# Patient Record
Sex: Male | Born: 1975 | Race: White | Hispanic: No | Smoking: Former smoker
Health system: Southern US, Community
[De-identification: ages and names within clinical notes are randomized; demographics above are authoritative.]

## PROBLEM LIST (undated history)

## (undated) DIAGNOSIS — K219 Gastro-esophageal reflux disease without esophagitis: Secondary | ICD-10-CM

## (undated) DIAGNOSIS — F988 Other specified behavioral and emotional disorders with onset usually occurring in childhood and adolescence: Secondary | ICD-10-CM

## (undated) DIAGNOSIS — F419 Anxiety disorder, unspecified: Secondary | ICD-10-CM

## (undated) DIAGNOSIS — Z973 Presence of spectacles and contact lenses: Secondary | ICD-10-CM

## (undated) DIAGNOSIS — Q309 Congenital malformation of nose, unspecified: Secondary | ICD-10-CM

## (undated) HISTORY — PX: WISDOM TOOTH EXTRACTION: SHX21

---

## 2016-12-30 ENCOUNTER — Other Ambulatory Visit: Payer: Self-pay | Admitting: Gastroenterology

## 2016-12-30 DIAGNOSIS — R10814 Left lower quadrant abdominal tenderness: Secondary | ICD-10-CM

## 2017-01-04 ENCOUNTER — Ambulatory Visit
Admission: RE | Admit: 2017-01-04 | Discharge: 2017-01-04 | Disposition: A | Discharge status: Home / Self Care | Payer: BLUE CROSS/BLUE SHIELD | Source: Ambulatory Visit | Attending: Gastroenterology | Admitting: Gastroenterology

## 2017-01-04 DIAGNOSIS — R10814 Left lower quadrant abdominal tenderness: Secondary | ICD-10-CM

## 2017-01-04 MED ORDER — IOPAMIDOL (ISOVUE-300) INJECTION 61%
125.0000 mL | Freq: Once | INTRAVENOUS | Status: AC | PRN
  Administered 2017-01-04: 125 mL via INTRAVENOUS

## 2017-01-05 IMAGING — CT CT ABD-PELV W/ CM
3 of 5 series · 13 of 36 positions shown, 19 images · IV contrast (READICAT/WATER & [ID] ISOVUE 300)
Comparison: None.

CLINICAL DATA: Left lower abdominal mass for 2 months.

EXAM:
CT ABDOMEN AND PELVIS WITH CONTRAST
TECHNIQUE: Multidetector CT imaging of the abdomen and pelvis was performed
using the standard protocol following bolus administration of
intravenous contrast.
CONTRAST:  125mL [G7] IOPAMIDOL ([G7]) INJECTION 61%

[Series 3: abd/pelvis with · axial · 0.86mm/px · z∈[-394,-40]mm · 7 of 95 slices shown, 12 images]
[im 12/95  soft-tissue]
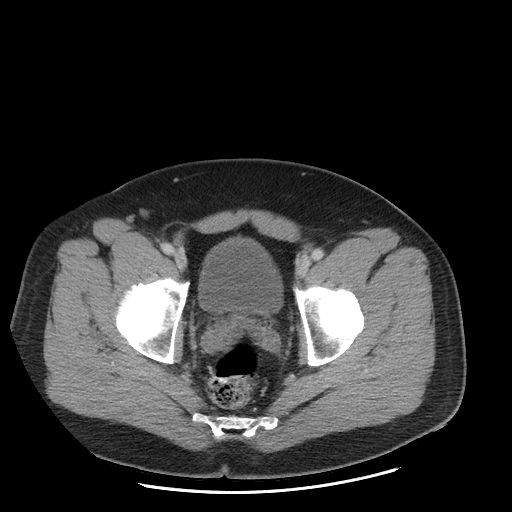
[im 12/95  bone]
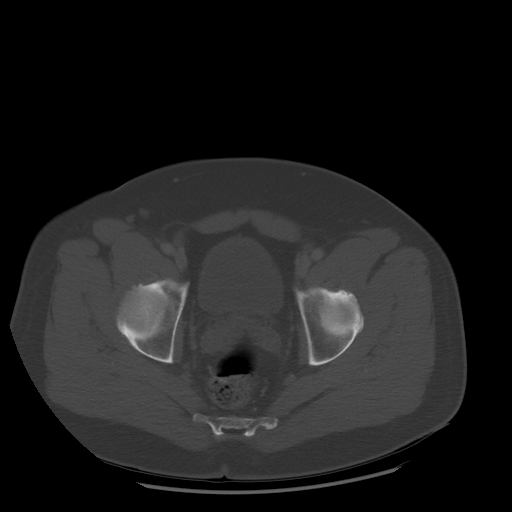
[im 24/95  soft-tissue]
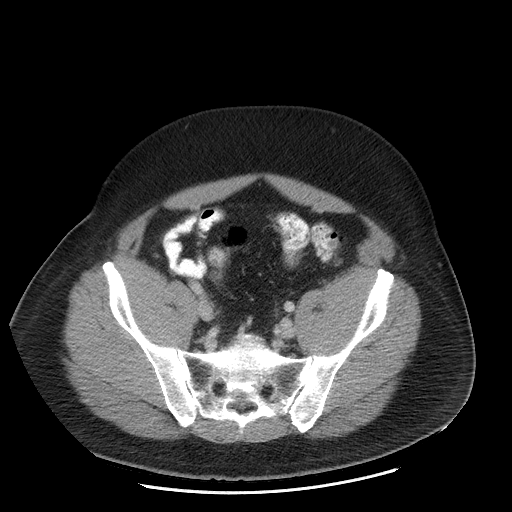
[im 36/95  soft-tissue]
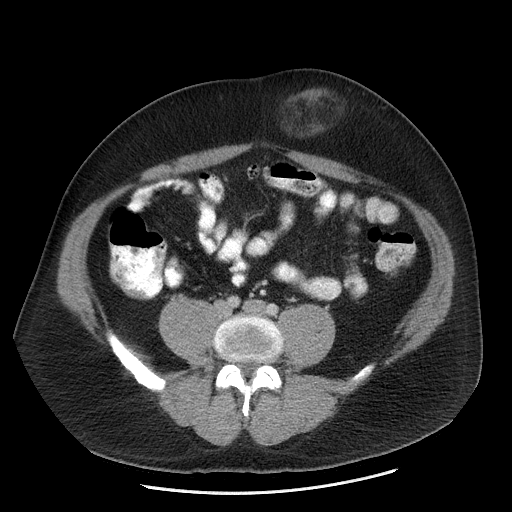
[im 48/95  soft-tissue]
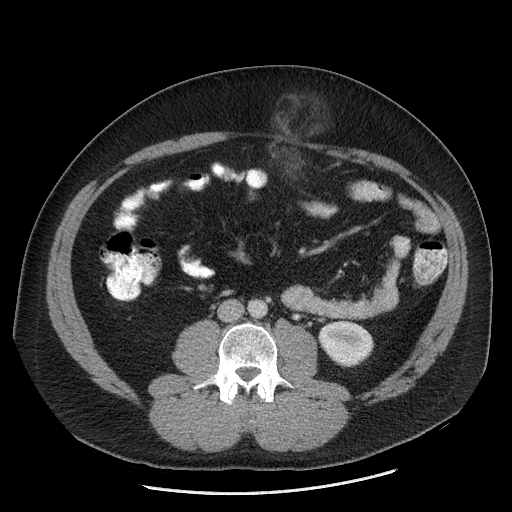
[im 48/95  lung]
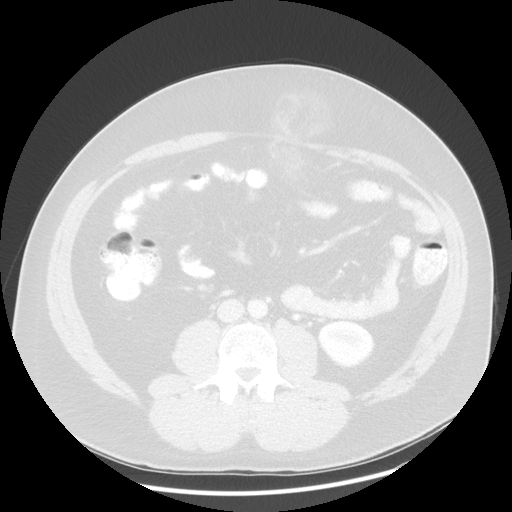
[im 59/95  soft-tissue]
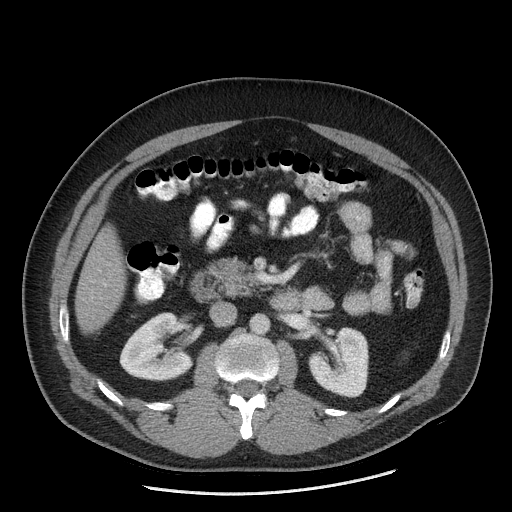
[im 59/95  lung]
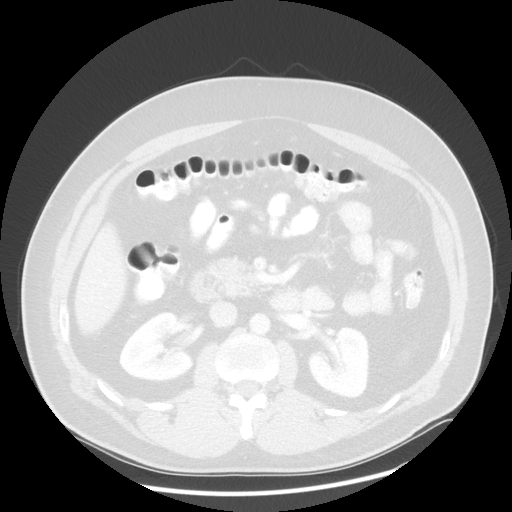
[im 71/95  soft-tissue]
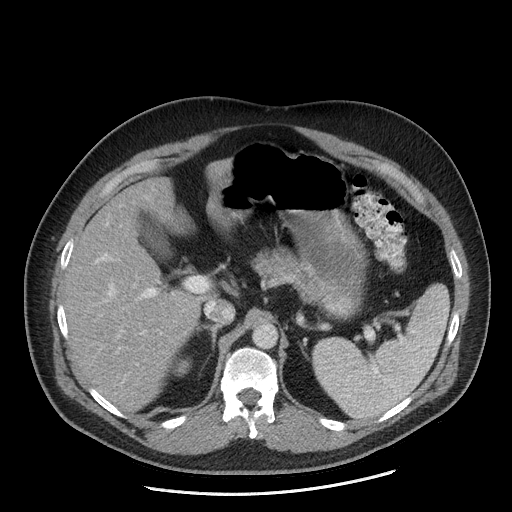
[im 71/95  lung]
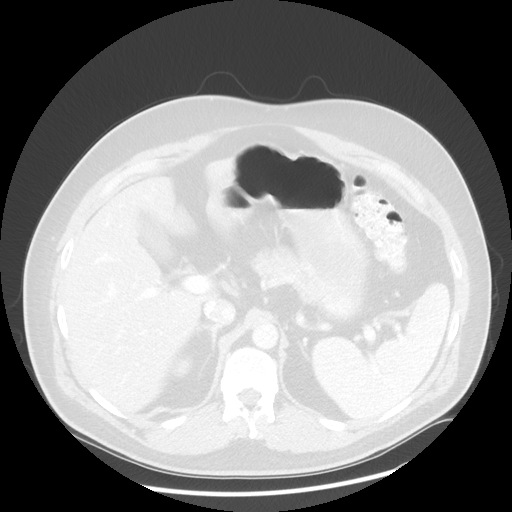
[im 83/95  soft-tissue]
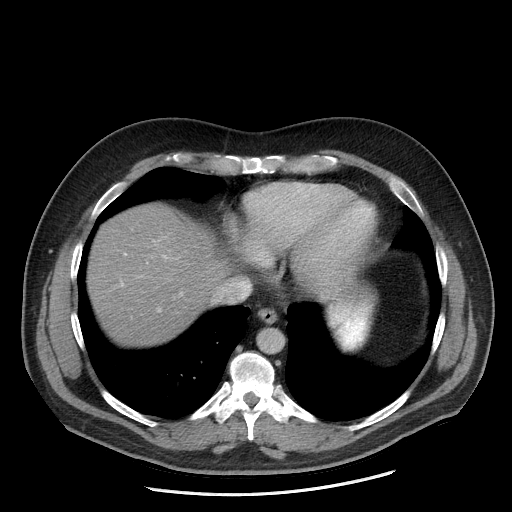
[im 83/95  lung]
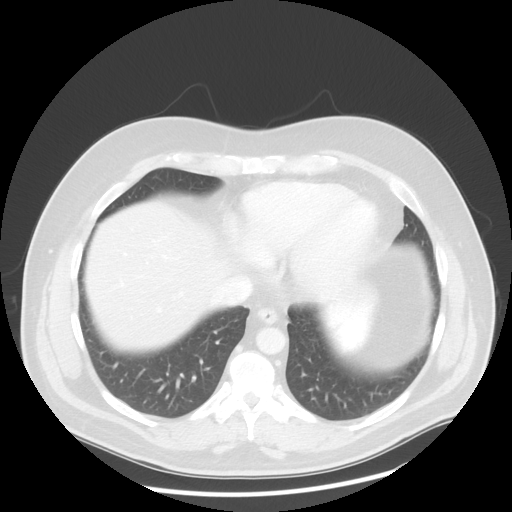

[Series 601: coronal body · coronal · 1.04mm/px · 1 of 147 slices shown, 2 images]
[im 49/147  soft-tissue]
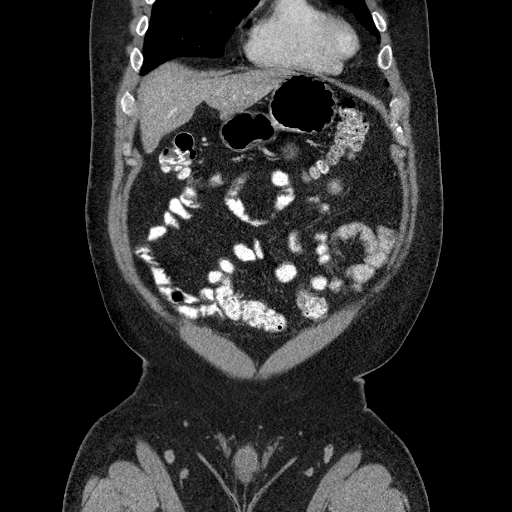
[im 49/147  bone]
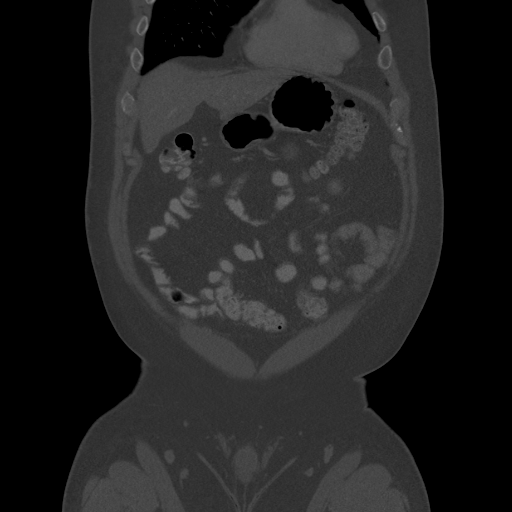

[Series 602: sagittal body · sagittal · 1.04mm/px · 5 of 173 slices shown]
[im 12/173  soft-tissue]
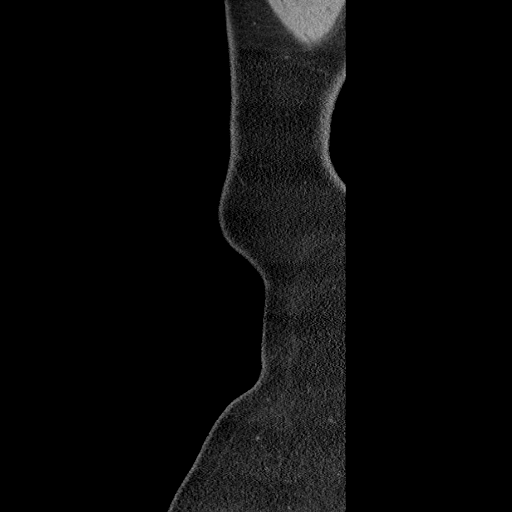
[im 35/173  soft-tissue]
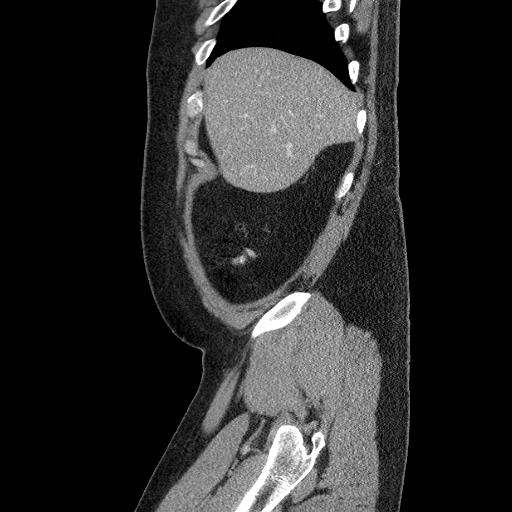
[im 58/173  soft-tissue]
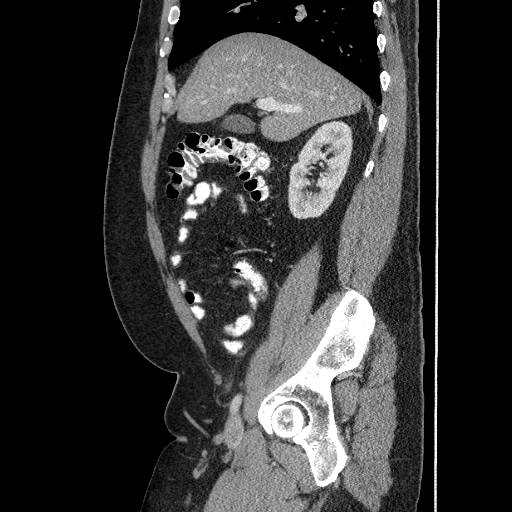
[im 81/173  soft-tissue]
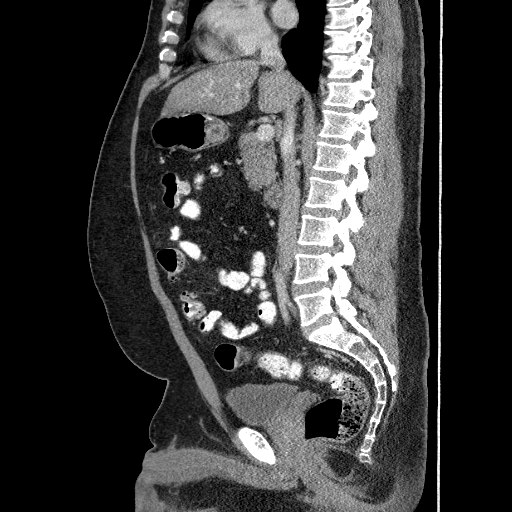
[im 92/173  soft-tissue]
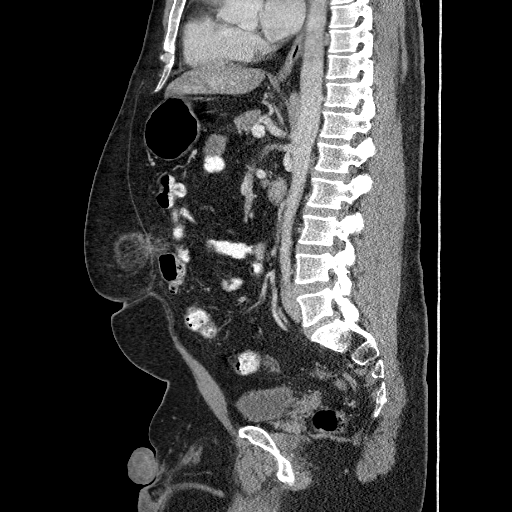

[13 of 36 positions shown; findings below may reference images not displayed]

FINDINGS: Lower chest: No acute abnormality.

Hepatobiliary: No focal liver abnormality is seen. No gallstones,
gallbladder wall thickening, or biliary dilatation.

Pancreas: Unremarkable. No pancreatic ductal dilatation or
surrounding inflammatory changes.

Spleen: Normal in size without focal abnormality.

Adrenals/Urinary Tract: Adrenal glands are unremarkable. Kidneys are
normal, without renal calculi, focal lesion, or hydronephrosis.
Bladder is unremarkable.

Stomach/Bowel: Stomach is within normal limits. Appendix appears
normal. No evidence of bowel wall thickening, distention, or
inflammatory changes.

Vascular/Lymphatic: No significant vascular findings are present. No
enlarged abdominal or pelvic lymph nodes.

Reproductive: Prostate is unremarkable.

Other: There is a supraumbilical ventral abdominal wall hernia,
containing fat only, which measures 7.5 by 6.4 by 7.4 cm (volume =
190). There is soft tissue stranding within the hernia which may be
a manifestation of incarceration.

Musculoskeletal: No acute or significant osseous findings.
IMPRESSION: 1. 190 cc Ventral abdominal wall hernia contains fat only. There is
soft tissue stranding throughout the herniated fat which may be a
manifestation of incarceration.
These results will be called to the ordering clinician or
representative by the Radiologist Assistant, and communication
documented in the PACS or zVision Dashboard.

## 2017-01-20 ENCOUNTER — Ambulatory Visit: Payer: Self-pay | Admitting: General Surgery

## 2017-01-20 NOTE — H&P (Signed)
Stephen Trujillo 01/20/2017 10:39 AM Location: [facility] Patient #: [id number] DOB: 01-16-1976 Undefined / Language: [place] / Race: White Male   History of Present Illness Stephen Trujillo(Stephen Trujillo M. Smt Lokey MD; 01/20/2017 11:13 AM) The patient is a 41 year old male who presents with an abdominal wall Trujillo. He is referred by Dr. Bosie Trujillo for evaluation of abdominal wall mass. He was found to have an incarcerated ventral Trujillo containing omentum on the CT scan of the end of August. The defect measured 5 cm wide by 4 cm vertical comes containing a large amount of omentum. He states that he has had a bulge for at least 3 months. It bothersome on a daily basis. It causes a bulging pressure and occasionally a tugging sensation. After eating he will have worsening pain in that location. He denies any nausea, vomiting, diarrhea or constipation. He does have some bloating but that is better now. He will have acid reflux issues and takes reflux medication. Occasionally he will have some bile come up at night. He did have a CT scan back in 2015 at the [facility] that showed a small fat-containing ventral Trujillo. He denies any prior abdominal surgery. He does not smoke. He is accompanied by his wife.   Problem List/Past Medical Stephen Trujillo(Stephen Trujillo M. Andrey CampanileWilson, MD; 01/20/2017 11:13 AM) Stephen Trujillo (K46.0)  OBESITY (BMI 30-39.9) (E66.9)   Past Surgical History (Stephen Trujillo, CMA; 01/20/2017 10:39 AM) Oral Surgery   Diagnostic Studies History (Stephen Trujillo, CMA; 01/20/2017 10:39 AM) Colonoscopy  never  Allergies (Stephen Trujillo, CMA; 01/20/2017 10:40 AM) Codeine Phosphate *ANALGESICS - OPIOID*  Vomiting.  Medication History (Stephen Trujillo, CMA; 01/20/2017 10:41 AM) Claritin (10MG  Tablet, Oral) Active. Tums (500MG  Tablet Chewable, Oral) Active. Zantac (150MG  Tablet, Oral) Active. Imodium A-D (2MG  Tablet, Oral) Active.  Social History (Stephen Trujillo, CMA; 01/20/2017 10:39 AM) Alcohol use   Recently quit alcohol use. Caffeine use  Coffee, Tea. Tobacco use  Former smoker.  Family History (Stephen Trujillo, CMA; 01/20/2017 10:39 AM) Alcohol Abuse  Mother. Depression  Mother. Diabetes Mellitus  Mother. Heart Disease  Mother. Hypertension  Mother. Respiratory Condition  Mother.  Other Problems Stephen Trujillo(Stephen Trujillo M. Andrey CampanileWilson, MD; 01/20/2017 11:13 AM) Gastroesophageal Reflux Disease     Review of Systems (Stephen Trujillo CMA; 01/20/2017 10:39 AM) General Not Present- Appetite Loss, Chills, Fatigue, Fever, Night Sweats, Weight Gain and Weight Loss. Skin Not Present- Change in Wart/Mole, Dryness, Hives, Jaundice, New Lesions, Non-Healing Wounds, Rash and Ulcer. HEENT Present- Seasonal Allergies, Sinus Pain and Wears glasses/contact lenses. Not Present- Earache, Hearing Loss, Hoarseness, Nose Bleed, Oral Ulcers, Ringing in the Ears, Sore Throat, Visual Disturbances and Yellow Eyes. Respiratory Present- Snoring. Not Present- Bloody sputum, Chronic Cough, Difficulty Breathing and Wheezing. Breast Not Present- Breast Mass, Breast Pain, Nipple Discharge and Skin Changes. Cardiovascular Not Present- Chest Pain, Difficulty Breathing Lying Down, Leg Cramps, Palpitations, Rapid Heart Rate, Shortness of Breath and Swelling of Extremities. Gastrointestinal Present- Abdominal Pain, Bloating, Change in Bowel Habits, Excessive gas, Nausea and Vomiting. Not Present- Bloody Stool, Chronic diarrhea, Constipation, Difficulty Swallowing, Gets full quickly at meals, Hemorrhoids, Indigestion and Rectal Pain. Male Genitourinary Not Present- Blood in Urine, Change in Urinary Stream, Frequency, Impotence, Nocturia, Painful Urination, Urgency and Urine Leakage. Musculoskeletal Not Present- Back Pain, Joint Pain, Joint Stiffness, Muscle Pain, Muscle Weakness and Swelling of Extremities. Neurological Present- Headaches. Not Present- Decreased Memory, Fainting, Numbness, Seizures, Tingling, Tremor, Trouble walking and  Weakness. Psychiatric Not Present- Anxiety, Bipolar, Change in Sleep Pattern, Depression, Fearful and Frequent crying. Endocrine  Not Present- Cold Intolerance, Excessive Hunger, Hair Changes, Heat Intolerance, Hot flashes and New Diabetes. Hematology Not Present- Blood Thinners, Easy Bruising, Excessive bleeding, Gland problems, HIV and Persistent Infections.  Vitals (Stephen Trujillo CMA; 01/20/2017 10:42 AM) 01/20/2017 10:41 AM Weight: 252.4 lb Height: 71in Body Surface Area: 2.33 m Body Mass Index: 35.2 kg/m  Pulse: 46 (Regular)  BP: 110/80 (Sitting, Left Arm, Standard)       Physical Exam (Stephen Trujillo M. Burnadette Baskett MD; 01/20/2017 11:09 AM) General Mental Status-Alert. General Appearance-Consistent with stated age. Hydration-Well hydrated. Voice-Normal. Note: obese, mostly central   Head and Neck Head-normocephalic, atraumatic with no lesions or palpable masses. Trachea-midline. Thyroid Gland Characteristics - normal size and consistency.  Eye Eyeball - Bilateral-Extraocular movements intact. Sclera/Conjunctiva - Bilateral-No scleral icterus.  ENMT Ears -Note: normal external ears.  Mouth and Throat -Note: lips.   Chest and Lung Exam Chest and lung exam reveals -quiet, even and easy respiratory effort with no use of accessory muscles and on auscultation, normal breath sounds, no adventitious sounds and normal vocal resonance. Inspection Chest Wall - Normal. Back - normal.  Breast - Did not examine.  Cardiovascular Cardiovascular examination reveals -normal heart sounds, regular rate and rhythm with no murmurs and normal pedal pulses bilaterally.  Abdomen Inspection  Inspection of the abdomen reveals: Note: palpable bulge just to left of umbilicus. mild tenderness, not reducible. no skin changes. some abd wall striae. Skin - Scar - no surgical scars. Palpation/Percussion Palpation and Percussion of the abdomen reveal - Soft, Non Tender,  No Rebound tenderness, No Rigidity (guarding) and No hepatosplenomegaly. Auscultation Auscultation of the abdomen reveals - Bowel sounds normal.  Peripheral Vascular Upper Extremity Palpation - Pulses bilaterally normal.  Neurologic Neurologic evaluation reveals -alert and oriented x 3 with no impairment of recent or remote memory. Mental Status-Normal.  Neuropsychiatric The patient's mood and affect are described as -normal. Judgment and Insight-insight is appropriate concerning matters relevant to self.  Musculoskeletal Normal Exam - Left-Upper Extremity Strength Normal and Lower Extremity Strength Normal. Normal Exam - Right-Upper Extremity Strength Normal and Lower Extremity Strength Normal.  Lymphatic Head & Neck  General Head & Neck Lymphatics: Bilateral - Description - Normal. Axillary - Did not examine. Femoral & Inguinal - Did not examine.    Assessment & Plan (Stephen Trujillo M. 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 We discussed the signs and symptoms of incarceration and strangulation. The patient was given educational material. I also drew diagrams.  We discussed nonoperative and operative management. With respect to operative management, we discussed both open repair, laparoscopic, and laparoscopic assisted repair. We discussed the pros and cons of each approach. I discussed the typical aftercare with each procedure and how each procedure differs.  The patient has elected to proceed with laparoscopic assisted repair of incarcerated ventral Trujillo iwth mesh  We discussed the risk and benefits of surgery including but not limited to bleeding, infection, injury to surrounding structures, Trujillo recurrence, mesh complications, hematoma/seroma formation, need to convert to an open procedure, blood clot formation, urinary retention, post operative ileus, general anesthesia risk,  long-term abdominal pain. We discussed that this procedure can be quite uncomfortable and difficult to recover from based on how the mesh is secured to the abdominal wall. We discussed the importance of avoiding heavy lifting and straining for a period of 6 weeks. Current Plans Pt Education - Pamphlet Given - Trujillo Surgery: discussed with patient and provided information. You are being   scheduled for surgery- Our schedulers will call you.  You should hear from our office's scheduling department within 5 working days about the location, date, and time of surgery. We try to make accommodations for patient's preferences in scheduling surgery, but sometimes the OR schedule or the surgeon's schedule prevents [place] from making those accommodations.  If you have not heard from our office [phone]) in 5 working days, call the office and ask for your surgeon's nurse.  If you have other questions about your diagnosis, plan, or surgery, call the office and ask for your surgeon's nurse.  OBESITY (BMI 30-39.9) (E66.9) Impression: We discussed the importance of working on weight loss now as well as after surgery to minimize recurrence risk. Because he is symptomatic I don't think that we can necessary delay surgery for several months  Mary Sella. Andrey Campanile, MD, FACS General, Bariatric, & Minimally Invasive Surgery [facility], [place]

## 2017-01-24 ENCOUNTER — Encounter (HOSPITAL_COMMUNITY): Payer: Self-pay | Admitting: *Deleted

## 2017-01-24 MED ORDER — DEXTROSE 5 % IV SOLN
3.0000 g | INTRAVENOUS | Status: AC
  Administered 2017-01-25: 3 g via INTRAVENOUS
  Filled 2017-01-24: qty 3

## 2017-01-24 NOTE — Progress Notes (Signed)
Called patient and spoke with his girlfriend, Stephen Trujillo. Time change for surgery 11/24/16. Patient to arrive 0900 for 1100 surgery. Clear liquids until 5 AM. No solids after midnight. [name] verbalizes understanding.

## 2017-01-25 ENCOUNTER — Encounter (HOSPITAL_COMMUNITY): Payer: Self-pay | Admitting: *Deleted

## 2017-01-25 ENCOUNTER — Ambulatory Visit (HOSPITAL_COMMUNITY): Payer: BLUE CROSS/BLUE SHIELD | Admitting: Certified Registered"

## 2017-01-25 ENCOUNTER — Observation Stay (HOSPITAL_COMMUNITY)
Admission: RE | Admit: 2017-01-25 | Discharge: 2017-01-26 | Disposition: A | Discharge status: Home / Self Care | Payer: BLUE CROSS/BLUE SHIELD | Source: Ambulatory Visit | Attending: General Surgery | Admitting: General Surgery

## 2017-01-25 ENCOUNTER — Encounter (HOSPITAL_COMMUNITY)
Admission: RE | Disposition: A | Discharge status: Home / Self Care | Payer: Self-pay | Source: Ambulatory Visit | Attending: General Surgery

## 2017-01-25 DIAGNOSIS — Z87891 Personal history of nicotine dependence: Secondary | ICD-10-CM | POA: Diagnosis not present

## 2017-01-25 DIAGNOSIS — K219 Gastro-esophageal reflux disease without esophagitis: Secondary | ICD-10-CM | POA: Insufficient documentation

## 2017-01-25 DIAGNOSIS — Z6835 Body mass index (BMI) 35.0-35.9, adult: Secondary | ICD-10-CM | POA: Insufficient documentation

## 2017-01-25 DIAGNOSIS — Z811 Family history of alcohol abuse and dependence: Secondary | ICD-10-CM | POA: Diagnosis not present

## 2017-01-25 DIAGNOSIS — Z79899 Other long term (current) drug therapy: Secondary | ICD-10-CM | POA: Insufficient documentation

## 2017-01-25 DIAGNOSIS — K4030 Unilateral inguinal hernia, with obstruction, without gangrene, not specified as recurrent: Principal | ICD-10-CM | POA: Insufficient documentation

## 2017-01-25 DIAGNOSIS — Z8249 Family history of ischemic heart disease and other diseases of the circulatory system: Secondary | ICD-10-CM | POA: Insufficient documentation

## 2017-01-25 DIAGNOSIS — Z833 Family history of diabetes mellitus: Secondary | ICD-10-CM | POA: Insufficient documentation

## 2017-01-25 DIAGNOSIS — E669 Obesity, unspecified: Secondary | ICD-10-CM | POA: Diagnosis not present

## 2017-01-25 DIAGNOSIS — K436 Other and unspecified ventral hernia with obstruction, without gangrene: Secondary | ICD-10-CM | POA: Diagnosis present

## 2017-01-25 HISTORY — PX: INSERTION OF MESH: SHX5868

## 2017-01-25 HISTORY — DX: Other specified behavioral and emotional disorders with onset usually occurring in childhood and adolescence: F98.8

## 2017-01-25 HISTORY — DX: Gastro-esophageal reflux disease without esophagitis: K21.9

## 2017-01-25 HISTORY — PX: LAPAROSCOPIC ASSISTED VENTRAL HERNIA REPAIR: SHX6312

## 2017-01-25 HISTORY — DX: Presence of spectacles and contact lenses: Z97.3

## 2017-01-25 HISTORY — DX: Anxiety disorder, unspecified: F41.9

## 2017-01-25 HISTORY — DX: Congenital malformation of nose, unspecified: Q30.9

## 2017-01-25 LAB — CBC
HCT: 43.5 % (ref 39.0–52.0)
Hemoglobin: 15.1 g/dL (ref 13.0–17.0)
MCH: 30.1 pg (ref 26.0–34.0)
MCHC: 34.7 g/dL (ref 30.0–36.0)
MCV: 86.8 fL (ref 78.0–100.0)
Platelets: 140 10*3/uL — ABNORMAL LOW (ref 150–400)
RBC: 5.01 MIL/uL (ref 4.22–5.81)
RDW: 12.6 % (ref 11.5–15.5)
WBC: 3.6 10*3/uL — AB (ref 4.0–10.5)

## 2017-01-25 LAB — CREATININE, SERUM: CREATININE: 0.93 mg/dL (ref 0.61–1.24)

## 2017-01-25 SURGERY — REPAIR, HERNIA, VENTRAL, LAPAROSCOPY-ASSISTED
Anesthesia: General

## 2017-01-25 MED ORDER — ZOLPIDEM TARTRATE 5 MG PO TABS: 5.0000 mg | ORAL_TABLET | Freq: Every evening | ORAL | Status: DC | PRN

## 2017-01-25 MED ORDER — KETAMINE HCL 10 MG/ML IJ SOLN
INTRAMUSCULAR | Status: DC | PRN
  Administered 2017-01-25 (×2): 20 mg via INTRAVENOUS

## 2017-01-25 MED ORDER — SODIUM CHLORIDE 0.9 % IV SOLN
INTRAVENOUS | Status: DC | PRN
  Administered 2017-01-25: 500 mL

## 2017-01-25 MED ORDER — RINGERS IRRIGATION IR SOLN
Status: DC | PRN
  Administered 2017-01-25: 1000 mL

## 2017-01-25 MED ORDER — SODIUM CHLORIDE 0.9 % IJ SOLN
INTRAMUSCULAR | Status: AC
  Filled 2017-01-25: qty 50

## 2017-01-25 MED ORDER — DEXAMETHASONE SODIUM PHOSPHATE 10 MG/ML IJ SOLN
INTRAMUSCULAR | Status: DC | PRN
  Administered 2017-01-25: 8 mg via INTRAVENOUS

## 2017-01-25 MED ORDER — BUPIVACAINE LIPOSOME 1.3 % IJ SUSP
20.0000 mL | Freq: Once | INTRAMUSCULAR | Status: AC
  Administered 2017-01-25: 20 mL
  Filled 2017-01-25: qty 20

## 2017-01-25 MED ORDER — ROCURONIUM BROMIDE 50 MG/5ML IV SOSY
PREFILLED_SYRINGE | INTRAVENOUS | Status: AC
  Filled 2017-01-25: qty 10

## 2017-01-25 MED ORDER — SODIUM CHLORIDE 0.9 % IV SOLN
INTRAVENOUS | Status: AC
  Filled 2017-01-25: qty 500000

## 2017-01-25 MED ORDER — ROCURONIUM BROMIDE 50 MG/5ML IV SOSY
PREFILLED_SYRINGE | INTRAVENOUS | Status: AC
  Filled 2017-01-25: qty 5

## 2017-01-25 MED ORDER — PROMETHAZINE HCL 25 MG/ML IJ SOLN: 12.5000 mg | Freq: Four times a day (QID) | INTRAMUSCULAR | Status: DC | PRN

## 2017-01-25 MED ORDER — FENTANYL CITRATE (PF) 250 MCG/5ML IJ SOLN
INTRAMUSCULAR | Status: DC | PRN
  Administered 2017-01-25 (×7): 50 ug via INTRAVENOUS

## 2017-01-25 MED ORDER — MIDAZOLAM HCL 2 MG/2ML IJ SOLN
INTRAMUSCULAR | Status: DC | PRN
  Administered 2017-01-25: 2 mg via INTRAVENOUS

## 2017-01-25 MED ORDER — LIDOCAINE 2% (20 MG/ML) 5 ML SYRINGE
INTRAMUSCULAR | Status: DC | PRN
  Administered 2017-01-25: 1.5 mg/kg/h via INTRAVENOUS

## 2017-01-25 MED ORDER — ONDANSETRON HCL 4 MG/2ML IJ SOLN
INTRAMUSCULAR | Status: AC
  Filled 2017-01-25: qty 2

## 2017-01-25 MED ORDER — ONDANSETRON HCL 4 MG/2ML IJ SOLN: 4.0000 mg | Freq: Four times a day (QID) | INTRAMUSCULAR | Status: DC | PRN

## 2017-01-25 MED ORDER — EPHEDRINE SULFATE-NACL 50-0.9 MG/10ML-% IV SOSY
PREFILLED_SYRINGE | INTRAVENOUS | Status: DC | PRN
  Administered 2017-01-25 (×2): 5 mg via INTRAVENOUS

## 2017-01-25 MED ORDER — MORPHINE SULFATE (PF) 4 MG/ML IV SOLN: 1.0000 mg | INTRAVENOUS | Status: DC | PRN

## 2017-01-25 MED ORDER — FENTANYL CITRATE (PF) 250 MCG/5ML IJ SOLN
INTRAMUSCULAR | Status: AC
  Filled 2017-01-25: qty 5

## 2017-01-25 MED ORDER — PROPOFOL 10 MG/ML IV BOLUS
INTRAVENOUS | Status: DC | PRN
  Administered 2017-01-25: 200 mg via INTRAVENOUS

## 2017-01-25 MED ORDER — CHLORHEXIDINE GLUCONATE CLOTH 2 % EX PADS: 6.0000 | MEDICATED_PAD | Freq: Once | CUTANEOUS | Status: DC

## 2017-01-25 MED ORDER — DEXAMETHASONE SODIUM PHOSPHATE 10 MG/ML IJ SOLN
INTRAMUSCULAR | Status: AC
  Filled 2017-01-25: qty 1

## 2017-01-25 MED ORDER — PROPOFOL 10 MG/ML IV BOLUS
INTRAVENOUS | Status: AC
  Filled 2017-01-25: qty 20

## 2017-01-25 MED ORDER — LIDOCAINE 2% (20 MG/ML) 5 ML SYRINGE
INTRAMUSCULAR | Status: AC
  Filled 2017-01-25: qty 5

## 2017-01-25 MED ORDER — LACTATED RINGERS IV SOLN
INTRAVENOUS | Status: DC
  Administered 2017-01-25 – 2017-01-26 (×5): via INTRAVENOUS

## 2017-01-25 MED ORDER — SUGAMMADEX SODIUM 200 MG/2ML IV SOLN
INTRAVENOUS | Status: DC | PRN
  Administered 2017-01-25: 200 mg via INTRAVENOUS

## 2017-01-25 MED ORDER — PHENYLEPHRINE 40 MCG/ML (10ML) SYRINGE FOR IV PUSH (FOR BLOOD PRESSURE SUPPORT)
PREFILLED_SYRINGE | INTRAVENOUS | Status: AC
  Filled 2017-01-25: qty 10

## 2017-01-25 MED ORDER — DOCUSATE SODIUM 100 MG PO CAPS
100.0000 mg | ORAL_CAPSULE | Freq: Two times a day (BID) | ORAL | Status: DC
  Administered 2017-01-25 – 2017-01-26 (×2): 100 mg via ORAL
  Filled 2017-01-25 (×2): qty 1

## 2017-01-25 MED ORDER — SUGAMMADEX SODIUM 200 MG/2ML IV SOLN
INTRAVENOUS | Status: AC
  Filled 2017-01-25: qty 2

## 2017-01-25 MED ORDER — PANTOPRAZOLE SODIUM 40 MG IV SOLR
40.0000 mg | Freq: Every day | INTRAVENOUS | Status: DC
  Administered 2017-01-25: 23:00:00 40 mg via INTRAVENOUS
  Filled 2017-01-25: qty 40

## 2017-01-25 MED ORDER — MEPERIDINE HCL 50 MG/ML IJ SOLN: 6.2500 mg | INTRAMUSCULAR | Status: DC | PRN

## 2017-01-25 MED ORDER — ONDANSETRON 4 MG PO TBDP
4.0000 mg | ORAL_TABLET | Freq: Four times a day (QID) | ORAL | Status: DC | PRN
  Administered 2017-01-25: 4 mg via ORAL
  Filled 2017-01-25: qty 1

## 2017-01-25 MED ORDER — LACTATED RINGERS IV SOLN: INTRAVENOUS | Status: DC

## 2017-01-25 MED ORDER — LIDOCAINE 2% (20 MG/ML) 5 ML SYRINGE
INTRAMUSCULAR | Status: DC | PRN
  Administered 2017-01-25: 60 mg via INTRAVENOUS

## 2017-01-25 MED ORDER — GABAPENTIN 300 MG PO CAPS
300.0000 mg | ORAL_CAPSULE | ORAL | Status: AC
  Administered 2017-01-25: 300 mg via ORAL
  Filled 2017-01-25: qty 1

## 2017-01-25 MED ORDER — SODIUM CHLORIDE 0.9 % IJ SOLN
INTRAMUSCULAR | Status: DC | PRN
  Administered 2017-01-25: 50 mL

## 2017-01-25 MED ORDER — KETAMINE HCL 10 MG/ML IJ SOLN
INTRAMUSCULAR | Status: AC
  Filled 2017-01-25: qty 1

## 2017-01-25 MED ORDER — GABAPENTIN 300 MG PO CAPS
300.0000 mg | ORAL_CAPSULE | Freq: Two times a day (BID) | ORAL | Status: DC
  Administered 2017-01-25 – 2017-01-26 (×2): 300 mg via ORAL
  Filled 2017-01-25 (×2): qty 1

## 2017-01-25 MED ORDER — ONDANSETRON HCL 4 MG/2ML IJ SOLN
INTRAMUSCULAR | Status: DC | PRN
  Administered 2017-01-25: 4 mg via INTRAVENOUS

## 2017-01-25 MED ORDER — KETOROLAC TROMETHAMINE 30 MG/ML IJ SOLN
30.0000 mg | Freq: Three times a day (TID) | INTRAMUSCULAR | Status: DC
  Administered 2017-01-25 – 2017-01-26 (×2): 30 mg via INTRAVENOUS
  Filled 2017-01-25 (×2): qty 1

## 2017-01-25 MED ORDER — SUCCINYLCHOLINE CHLORIDE 200 MG/10ML IV SOSY
PREFILLED_SYRINGE | INTRAVENOUS | Status: DC | PRN
  Administered 2017-01-25: 120 mg via INTRAVENOUS

## 2017-01-25 MED ORDER — METOCLOPRAMIDE HCL 5 MG/ML IJ SOLN: 10.0000 mg | Freq: Once | INTRAMUSCULAR | Status: DC | PRN

## 2017-01-25 MED ORDER — ACETAMINOPHEN 500 MG PO TABS
1000.0000 mg | ORAL_TABLET | ORAL | Status: AC
  Administered 2017-01-25: 1000 mg via ORAL
  Filled 2017-01-25: qty 2

## 2017-01-25 MED ORDER — MIDAZOLAM HCL 2 MG/2ML IJ SOLN
INTRAMUSCULAR | Status: AC
  Filled 2017-01-25: qty 2

## 2017-01-25 MED ORDER — SIMETHICONE 80 MG PO CHEW: 40.0000 mg | CHEWABLE_TABLET | Freq: Four times a day (QID) | ORAL | Status: DC | PRN

## 2017-01-25 MED ORDER — ACETAMINOPHEN 500 MG PO TABS
1000.0000 mg | ORAL_TABLET | Freq: Four times a day (QID) | ORAL | Status: DC
  Administered 2017-01-25 – 2017-01-26 (×4): 1000 mg via ORAL
  Filled 2017-01-25 (×4): qty 2

## 2017-01-25 MED ORDER — FENTANYL CITRATE (PF) 100 MCG/2ML IJ SOLN: 25.0000 ug | INTRAMUSCULAR | Status: DC | PRN

## 2017-01-25 MED ORDER — EPHEDRINE 5 MG/ML INJ
INTRAVENOUS | Status: AC
  Filled 2017-01-25: qty 10

## 2017-01-25 MED ORDER — GLYCOPYRROLATE 0.2 MG/ML IV SOSY
PREFILLED_SYRINGE | INTRAVENOUS | Status: AC
  Filled 2017-01-25: qty 5

## 2017-01-25 MED ORDER — DIPHENHYDRAMINE HCL 50 MG/ML IJ SOLN: 12.5000 mg | Freq: Four times a day (QID) | INTRAMUSCULAR | Status: DC | PRN

## 2017-01-25 MED ORDER — ROCURONIUM BROMIDE 10 MG/ML (PF) SYRINGE
PREFILLED_SYRINGE | INTRAVENOUS | Status: DC | PRN
  Administered 2017-01-25: 5 mg via INTRAVENOUS
  Administered 2017-01-25: 30 mg via INTRAVENOUS
  Administered 2017-01-25 (×2): 10 mg via INTRAVENOUS

## 2017-01-25 MED ORDER — DIPHENHYDRAMINE HCL 12.5 MG/5ML PO ELIX: 12.5000 mg | ORAL_SOLUTION | Freq: Four times a day (QID) | ORAL | Status: DC | PRN

## 2017-01-25 MED ORDER — ENOXAPARIN SODIUM 40 MG/0.4ML ~~LOC~~ SOLN: 40.0000 mg | SUBCUTANEOUS | Status: DC

## 2017-01-25 MED ORDER — OXYCODONE HCL 5 MG PO TABS: 5.0000 mg | ORAL_TABLET | ORAL | Status: DC | PRN

## 2017-01-25 MED ORDER — GLYCOPYRROLATE 0.2 MG/ML IV SOSY
PREFILLED_SYRINGE | INTRAVENOUS | Status: DC | PRN
  Administered 2017-01-25: .2 mg via INTRAVENOUS

## 2017-01-25 SURGICAL SUPPLY — 47 items
APPLIER CLIP LOGIC TI 5 (MISCELLANEOUS) IMPLANT
BENZOIN TINCTURE PRP APPL 2/3 (GAUZE/BANDAGES/DRESSINGS) ×3 IMPLANT
BINDER ABDOMINAL 12 ML 46-62 (SOFTGOODS) IMPLANT
CABLE HIGH FREQUENCY MONO STRZ (ELECTRODE) ×3 IMPLANT
CHLORAPREP W/TINT 26ML (MISCELLANEOUS) ×3 IMPLANT
CLOSURE WOUND 1/2 X4 (GAUZE/BANDAGES/DRESSINGS) ×1
COVER SURGICAL LIGHT HANDLE (MISCELLANEOUS) ×3 IMPLANT
DECANTER SPIKE VIAL GLASS SM (MISCELLANEOUS) ×3 IMPLANT
DERMABOND ADVANCED (GAUZE/BANDAGES/DRESSINGS)
DERMABOND ADVANCED .7 DNX12 (GAUZE/BANDAGES/DRESSINGS) IMPLANT
DEVICE SECURE STRAP 25 ABSORB (INSTRUMENTS) IMPLANT
DEVICE TROCAR PUNCTURE CLOSURE (ENDOMECHANICALS) ×3 IMPLANT
DRAIN CHANNEL 19F RND (DRAIN) IMPLANT
DRAPE INCISE IOBAN 66X45 STRL (DRAPES) IMPLANT
DRAPE UTILITY XL STRL (DRAPES) IMPLANT
DRSG OPSITE POSTOP 4X6 (GAUZE/BANDAGES/DRESSINGS) ×3 IMPLANT
ELECT PENCIL ROCKER SW 15FT (MISCELLANEOUS) IMPLANT
ELECT REM PT RETURN 15FT ADLT (MISCELLANEOUS) ×3 IMPLANT
EVACUATOR SILICONE 100CC (DRAIN) IMPLANT
GOWN STRL REUS W/TWL XL LVL3 (GOWN DISPOSABLE) ×9 IMPLANT
KIT BASIN OR (CUSTOM PROCEDURE TRAY) ×3 IMPLANT
L-HOOK LAP DISP 36CM (ELECTROSURGICAL)
LHOOK LAP DISP 36CM (ELECTROSURGICAL) IMPLANT
MARKER SKIN DUAL TIP RULER LAB (MISCELLANEOUS) ×3 IMPLANT
MESH VENTRALIGHT ST 6IN CRC (Mesh General) ×3 IMPLANT
NEEDLE SPNL 22GX3.5 QUINCKE BK (NEEDLE) ×6 IMPLANT
PAD POSITIONING PINK XL (MISCELLANEOUS) IMPLANT
SCISSORS LAP 5X35 DISP (ENDOMECHANICALS) ×3 IMPLANT
SET IRRIG TUBING LAPAROSCOPIC (IRRIGATION / IRRIGATOR) ×3 IMPLANT
SHEARS HARMONIC ACE PLUS 36CM (ENDOMECHANICALS) IMPLANT
SLEEVE XCEL OPT CAN 5 100 (ENDOMECHANICALS) ×3 IMPLANT
STRIP CLOSURE SKIN 1/2X4 (GAUZE/BANDAGES/DRESSINGS) ×2 IMPLANT
SUT ETHILON 2 0 PS N (SUTURE) IMPLANT
SUT MNCRL AB 4-0 PS2 18 (SUTURE) ×3 IMPLANT
SUT NOVA 1 T20/GS 25DT (SUTURE) ×3 IMPLANT
SUT NOVA NAB DX-16 0-1 5-0 T12 (SUTURE) ×6 IMPLANT
SUT NOVA NAB GS-21 0 18 T12 DT (SUTURE) ×3 IMPLANT
SUT VIC AB 3-0 SH 27 (SUTURE) ×2
SUT VIC AB 3-0 SH 27XBRD (SUTURE) ×1 IMPLANT
TOWEL OR 17X26 10 PK STRL BLUE (TOWEL DISPOSABLE) ×3 IMPLANT
TOWEL OR NON WOVEN STRL DISP B (DISPOSABLE) ×3 IMPLANT
TRAY FOLEY W/METER SILVER 14FR (SET/KITS/TRAYS/PACK) IMPLANT
TRAY FOLEY W/METER SILVER 16FR (SET/KITS/TRAYS/PACK) ×3 IMPLANT
TRAY LAPAROSCOPIC (CUSTOM PROCEDURE TRAY) ×3 IMPLANT
TROCAR BLADELESS OPT 5 100 (ENDOMECHANICALS) ×3 IMPLANT
TROCAR XCEL NON-BLD 11X100MML (ENDOMECHANICALS) IMPLANT
TUBING INSUF HEATED (TUBING) ×3 IMPLANT

## 2017-01-25 NOTE — Op Note (Signed)
Stephen Trujillo [id number] June 13, 1975 01/25/2017  Laparoscopic Assisted Repair of Incarcerated Ventral Hernia with Mesh Procedure Note  Indications: Symptomatic incarcerated ventral hernia  Pre-operative Diagnosis: ventral incarcerated hernia  Post-operative Diagnosis: same  Surgeon: Atilano Ina   Assistants: none  Anesthesia: General endotracheal anesthesia  Procedure Details  The patient was seen in the Holding Room. The risks, benefits, complications, treatment options, and expected outcomes were discussed with the patient. The possibilities of reaction to medication, pulmonary aspiration, perforation of viscus, bleeding, recurrent infection, the need for additional procedures, failure to diagnose a condition, and creating a complication requiring transfusion or operation were discussed with the patient. The patient concurred with the proposed plan, giving informed consent.  The site of surgery properly noted/marked. The patient was taken to the operating room, identified as Stephen Trujillo and the procedure verified as laparoscopic assisted ventral hernia repair with mesh. A Time Out was held and the above information confirmed.  He received preop ERAS medications.   The patient was placed supine.  After establishing general anesthesia, a Foley catheter was placed.  The abdomen was prepped with Chloraprep and draped in standard fashion.  A 5 mm Optiview was used the cannulate the peritoneal cavity in the right upper quadrant below the costal margin since the hernia was to the left of the upper midline.  Pneumoperitoneum was obtained by insufflating CO2, maintaining a maximum pressure of 15 mmHg.  The 5 mm 30-degree laparoscopic was inserted.  There were significant omental stuck in and around the hernia defect.  An 5-mm port was placed in the right anterior axillary line at the level of the umbilicus.   Harmonic scalpel and gentle traction were used to dissect the omental adhesions away  from the anterior abdominal wall and traction to reduce the herniated omentum.  We cleared the entire abdominal wall and were able to visualize 1 fascial defects in the upper midline about 2 in above the umbilicus which is wider than taller that extended to the left. We used a spinal needle to identify the extent of the hernia defects.  This covered an area of about 6 x 3.5 cms.  We selected a round 6 inch piece of Bard VentralightSt mesh.  We placed 8 stay sutures of 0 Novofil around the edges of the mesh.     A transverse supraumbilical incision was created directly over the hernia.. Dissection was carried down to the hernia sac located above the fascia and was mobilized from surrounding structures. The hernia sac was quite thick and couldn't really invert back into the abdomen so it was excised with cautery.  Intact fascia was identified circumferentially around the defect. A small amount of soft tissue was mobilized from the surface of the fascia in a circumferential manner. The mesh was then placed thru the defect. The fascia was then closed transversely and primarily over the mesh with 7 interrupted 0-novafil sutures.  Pneumoperitoneum was re-established. I placed exparel in a regional manner around the fascial closure.  The mesh was then unrolled.  The stay sutures were then pulled up through small stab incisions using the Endo-close device.  This deployed the mesh widely over the closed fascial defect.  Exparel was then infiltrated around the sites of the stay sutures. The stay sutures were then tied down.  The Secure Strap device was then used to tack down the edges of the mesh at 1 cm intervals circumferentially. We placed a few tacks inside the outer ring of tacks.  We inspected  for hemostasis.    Pneumoperitoneum was then released as we removed the remainder of the trocars.   The small transverse cavity was irrigated with antibiotic irrigation. The soft tissue was closed with running 2-0 vicryl  suture.  The port sites were closed with 4-0 Monocryl.  All of the incisions and stay suture sites were then sealed with benzoin, steri-strips and bandages.  An abdominal binder was placed around the patient's abdomen.  The patient was extubated and brought to the recovery room in stable condition.  All sponge, instrument, and needle counts were correct prior to closure and at the conclusion of the case.   Findings: Type of repair - primary suture with mesh underlay  (choices - primary suture, mesh, or component)  Name of mesh - Bard VentralightST  Size of mesh - Round 6 inch  Mesh overlap - >5 cm  Placement of mesh -  beneath fascia and into peritoneal cavity,  (choices - beneath fascia and into peritoneal cavity, beneath fascia but external to peritoneal cavity, between the muscle and fascia, above or external to fascia)  Estimated Blood Loss:  less than 50 mL         Complications:  None; patient tolerated the procedure well.         Disposition: PACU - hemodynamically stable.         Condition: stable

## 2017-01-25 NOTE — Anesthesia Preprocedure Evaluation (Addendum)
Anesthesia Evaluation  Patient identified by MRN, date of birth, ID band Patient awake    Reviewed: Allergy & Precautions, NPO status , Patient's Chart, lab work & pertinent test results  Airway Mallampati: II  TM Distance: >3 FB Neck ROM: Full    Dental no notable dental hx. (+) Dental Advisory Given, Chipped   Pulmonary former smoker,    Pulmonary exam normal breath sounds clear to auscultation       Cardiovascular negative cardio ROS Normal cardiovascular exam Rhythm:Regular Rate:Normal     Neuro/Psych negative neurological ROS  negative psych ROS   GI/Hepatic negative GI ROS, Neg liver ROS,   Endo/Other  negative endocrine ROS  Renal/GU negative Renal ROS  negative genitourinary   Musculoskeletal negative musculoskeletal ROS (+)   Abdominal   Peds negative pediatric ROS (+)  Hematology negative hematology ROS (+)   Anesthesia Other Findings   Reproductive/Obstetrics negative OB ROS                            Anesthesia Physical Anesthesia Plan  ASA: II  Anesthesia Plan: General   Post-op Pain Management:    Induction: Intravenous  PONV Risk Score and Plan: 3 and Ondansetron, Dexamethasone, Midazolam and Treatment may vary due to age or medical condition  Airway Management Planned: Oral ETT  Additional Equipment:   Intra-op Plan:   Post-operative Plan: Extubation in OR  Informed Consent: I have reviewed the patients History and Physical, chart, labs and discussed the procedure including the risks, benefits and alternatives for the proposed anesthesia with the patient or authorized representative who has indicated his/her understanding and acceptance.   Dental advisory given  Plan Discussed with: CRNA  Anesthesia Plan Comments:         Anesthesia Quick Evaluation

## 2017-01-25 NOTE — Anesthesia Procedure Notes (Signed)
Date/Time: 01/25/2017 2:10 PM Performed by: Minerva Ends Oxygen Delivery Method: Simple face mask Placement Confirmation: positive ETCO2 and breath sounds checked- equal and bilateral Dental Injury: Teeth and Oropharynx as per pre-operative assessment  Comments: EXTUBATED--- FACE MASK--- GOOD aw-- O2 intact to PACU

## 2017-01-25 NOTE — Anesthesia Procedure Notes (Signed)
Procedure Name: Intubation Date/Time: 01/25/2017 11:47 AM Performed by: Minerva Ends Pre-anesthesia Checklist: Patient identified, Emergency Drugs available, Suction available and Patient being monitored Patient Re-evaluated:Patient Re-evaluated prior to induction Oxygen Delivery Method: Circle System Utilized Preoxygenation: Pre-oxygenation with 100% oxygen Induction Type: IV induction Ventilation: Mask ventilation without difficulty Laryngoscope Size: Miller and 2 Grade View: Grade I Tube type: Oral Number of attempts: 1 Airway Equipment and Method: Stylet Placement Confirmation: ETT inserted through vocal cords under direct vision,  positive ETCO2 and breath sounds checked- equal and bilateral Secured at: 21 cm Tube secured with: Tape Dental Injury: Teeth and Oropharynx as per pre-operative assessment  Comments: Smooth IV induction  [place]-- intubation AM CRNA--- front teeth with chipped -- irregular surfaces prior to intubation--- intubation atraumatic-- teeth unchanged--- bilat BS Car[place]an

## 2017-01-25 NOTE — Anesthesia Postprocedure Evaluation (Signed)
Anesthesia Post Note  Patient: Stephen Trujillo  Procedure(s) Performed: Procedure(s) (LRB): LAPAROSCOPIC ASSISTED REPAIR OF INCARCERATED VENTRAL HERNIA REPAIR (N/A) INSERTION OF MESH (N/A)     Patient location during evaluation: PACU Anesthesia Type: General Level of consciousness: awake and alert Pain management: pain level controlled Vital Signs Assessment: post-procedure vital signs reviewed and stable Respiratory status: spontaneous breathing, nonlabored ventilation, respiratory function stable and patient connected to nasal cannula oxygen Cardiovascular status: blood pressure returned to baseline and stable Postop Assessment: no apparent nausea or vomiting Anesthetic complications: no    Last Vitals:  Vitals:   01/25/17 1500 01/25/17 1515  BP: 120/81 120/68  Pulse: 88 68  Resp: 15 16  Temp: 36.4 C 36.6 C  SpO2: 95% 98%    Last Pain:  Vitals:   01/25/17 1430  TempSrc:   PainSc: Asleep                 Phillips Grout

## 2017-01-25 NOTE — Interval H&P Note (Signed)
History and Physical Interval Note:  01/25/2017 11:05 AM  Cleon Gustin  has presented today for surgery, with the diagnosis of incarcerated venteral hernia  The various methods of treatment have been discussed with the patient and family. After consideration of risks, benefits and other options for treatment, the patient has consented to  Procedure(s): LAPAROSCOPIC ASSISTED REPAIR OF INCARCERATED VENTRAL HERNIA REPAIR (N/A) INSERTION OF MESH (N/A) as a surgical intervention .  The patient's history has been reviewed, patient examined, no change in status, stable for surgery.  I have reviewed the patient's chart and labs.  Questions were answered to the patient's satisfaction.    Mary Sella. Andrey Campanile, MD, FACS General, Bariatric, & Minimally Invasive Surgery [facility], [place]  [facility]

## 2017-01-25 NOTE — Transfer of Care (Signed)
Immediate Anesthesia Transfer of Care Note  Patient: Stephen Trujillo  Procedure(s) Performed: Procedure(s): LAPAROSCOPIC ASSISTED REPAIR OF INCARCERATED VENTRAL HERNIA REPAIR (N/A) INSERTION OF MESH (N/A)  Patient Location: PACU  Anesthesia Type:General  Level of Consciousness: sedated  Airway & Oxygen Therapy: Patient Spontanous Breathing and Patient connected to face mask oxygen  Post-op Assessment: Report given to RN and Post -op Vital signs reviewed and stable  Post vital signs: Reviewed and stable  Last Vitals:  Vitals:   01/25/17 0900  BP: 127/72  Pulse: 61  Resp: 18  Temp: 36.5 C  SpO2: 97%    Last Pain:  Vitals:   01/25/17 0900  TempSrc: Oral      Patients Stated Pain Goal: 2 (01/25/17 0947)  Complications: No apparent anesthesia complications

## 2017-01-25 NOTE — H&P (View-Only) (Signed)
Stephen FurlongJason Trujillo 01/20/2017 10:39 AM Location: [facility] Patient #: [id number] DOB: 01-16-1976 Undefined / Language: [place] / Race: White Male   History of Present Illness Stephen Trujillo(Stephen Ruybal M. Heidy Mccubbin MD; 01/20/2017 11:13 AM) The patient is a 41 year old male who presents with an abdominal wall hernia. He is referred by Dr. Bosie ClosSchooler for evaluation of abdominal wall mass. He was found to have an incarcerated ventral hernia containing omentum on the CT scan of the end of August. The defect measured 5 cm wide by 4 cm vertical comes containing a large amount of omentum. He states that he has had a bulge for at least 3 months. It bothersome on a daily basis. It causes a bulging pressure and occasionally a tugging sensation. After eating he will have worsening pain in that location. He denies any nausea, vomiting, diarrhea or constipation. He does have some bloating but that is better now. He will have acid reflux issues and takes reflux medication. Occasionally he will have some bile come up at night. He did have a CT scan back in 2015 at the [facility] that showed a small fat-containing ventral hernia. He denies any prior abdominal surgery. He does not smoke. He is accompanied by his wife.   Problem List/Past Medical Stephen Trujillo(Stephen Rahming M. 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  Medication History (Stephen Trujillo, CMA; 01/20/2017 10:41 AM) Claritin (10MG  Tablet, Oral) Active. Tums (500MG  Tablet Chewable, Oral) Active. Zantac (150MG  Tablet, Oral) Active. Imodium A-D (2MG  Tablet, Oral) Active.  Social History (Stephen Trujillo, CMA; 01/20/2017 10:39 AM) Alcohol use   Recently quit alcohol use. Caffeine use  Coffee, Tea. Tobacco use  Former smoker.  Family History (Stephen Trujillo, CMA; 01/20/2017 10:39 AM) Alcohol Abuse  Mother. Depression  Mother. Diabetes Mellitus  Mother. Heart Disease  Mother. Hypertension  Mother. Respiratory Condition  Mother.  Other Problems Stephen Trujillo(Stephen Trujillo M. Andrey CampanileWilson, MD; 01/20/2017 11:13 AM) Gastroesophageal Reflux Disease     Review of Systems (Stephen Trujillo CMA; 01/20/2017 10:39 AM) General Not Present- Appetite Loss, Chills, Fatigue, Fever, Night Sweats, Weight Gain and Weight Loss. Skin Not Present- Change in Wart/Mole, Dryness, Hives, Jaundice, New Lesions, Non-Healing Wounds, Rash and Ulcer. HEENT Present- Seasonal Allergies, Sinus Pain and Wears glasses/contact lenses. Not Present- Earache, Hearing Loss, Hoarseness, Nose Bleed, Oral Ulcers, Ringing in the Ears, Sore Throat, Visual Disturbances and Yellow Eyes. Respiratory Present- Snoring. Not Present- Bloody sputum, Chronic Cough, Difficulty Breathing and Wheezing. Breast Not Present- Breast Mass, Breast Pain, Nipple Discharge and Skin Changes. Cardiovascular Not Present- Chest Pain, Difficulty Breathing Lying Down, Leg Cramps, Palpitations, Rapid Heart Rate, Shortness of Breath and Swelling of Extremities. Gastrointestinal Present- Abdominal Pain, Bloating, Change in Bowel Habits, Excessive gas, Nausea and Vomiting. Not Present- Bloody Stool, Chronic diarrhea, Constipation, Difficulty Swallowing, Gets full quickly at meals, Hemorrhoids, Indigestion and Rectal Pain. Male Genitourinary Not Present- Blood in Urine, Change in Urinary Stream, Frequency, Impotence, Nocturia, Painful Urination, Urgency and Urine Leakage. Musculoskeletal Not Present- Back Pain, Joint Pain, Joint Stiffness, Muscle Pain, Muscle Weakness and Swelling of Extremities. Neurological Present- Headaches. Not Present- Decreased Memory, Fainting, Numbness, Seizures, Tingling, Tremor, Trouble walking and  Weakness. Psychiatric Not Present- Anxiety, Bipolar, Change in Sleep Pattern, Depression, Fearful and Frequent crying. Endocrine  Not Present- Cold Intolerance, Excessive Hunger, Hair Changes, Heat Intolerance, Hot flashes and New Diabetes. Hematology Not Present- Blood Thinners, Easy Bruising, Excessive bleeding, Gland problems, HIV and Persistent Infections.  Vitals (Stephen Trujillo CMA; 01/20/2017 10:42 AM) 01/20/2017 10:41 AM Weight: 252.4 lb Height: 71in Body Surface Area: 2.33 m Body Mass Index: 35.2 kg/m  Pulse: 46 (Regular)  BP: 110/80 (Sitting, Left Arm, Standard)       Physical Exam Stephen Trujillo M. Chaunte Hornbeck MD; 01/20/2017 11:09 AM) General Mental Status-Alert. General Appearance-Consistent with stated age. Hydration-Well hydrated. Voice-Normal. Note: obese, mostly central   Head and Neck Head-normocephalic, atraumatic with no lesions or palpable masses. Trachea-midline. Thyroid Gland Characteristics - normal size and consistency.  Eye Eyeball - Bilateral-Extraocular movements intact. Sclera/Conjunctiva - Bilateral-No scleral icterus.  ENMT Ears -Note: normal external ears.  Mouth and Throat -Note: lips.   Chest and Lung Exam Chest and lung exam reveals -quiet, even and easy respiratory effort with no use of accessory muscles and on auscultation, normal breath sounds, no adventitious sounds and normal vocal resonance. Inspection Chest Wall - Normal. Back - normal.  Breast - Did not examine.  Cardiovascular Cardiovascular examination reveals -normal heart sounds, regular rate and rhythm with no murmurs and normal pedal pulses bilaterally.  Abdomen Inspection  Inspection of the abdomen reveals: Note: palpable bulge just to left of umbilicus. mild tenderness, not reducible. no skin changes. some abd wall striae. Skin - Scar - no surgical scars. Palpation/Percussion Palpation and Percussion of the abdomen reveal - Soft, Non Tender,  No Rebound tenderness, No Rigidity (guarding) and No hepatosplenomegaly. Auscultation Auscultation of the abdomen reveals - Bowel sounds normal.  Peripheral Vascular Upper Extremity Palpation - Pulses bilaterally normal.  Neurologic Neurologic evaluation reveals -alert and oriented x 3 with no impairment of recent or remote memory. Mental Status-Normal.  Neuropsychiatric The patient's mood and affect are described as -normal. Judgment and Insight-insight is appropriate concerning matters relevant to self.  Musculoskeletal Normal Exam - Left-Upper Extremity Strength Normal and Lower Extremity Strength Normal. Normal Exam - Right-Upper Extremity Strength Normal and Lower Extremity Strength Normal.  Lymphatic Head & Neck  General Head & Neck Lymphatics: Bilateral - Description - Normal. Axillary - Did not examine. Femoral & Inguinal - Did not examine.    Assessment & Plan Stephen Trujillo M. Verginia Toohey MD; 01/20/2017 11:11 AM) Stephen Trujillo VENTRAL HERNIA (K46.0) Impression: We discussed the etiology of ventral hernias. We discussed the signs and symptoms of incarceration and strangulation. The patient was given educational material. I also drew diagrams.  We discussed nonoperative and operative management. With respect to operative management, we discussed both open repair, laparoscopic, and laparoscopic assisted repair. We discussed the pros and cons of each approach. I discussed the typical aftercare with each procedure and how each procedure differs.  The patient has elected to proceed with laparoscopic assisted repair of incarcerated ventral hernia iwth mesh  We discussed the risk and benefits of surgery including but not limited to bleeding, infection, injury to surrounding structures, hernia recurrence, mesh complications, hematoma/seroma formation, need to convert to an open procedure, blood clot formation, urinary retention, post operative ileus, general anesthesia risk,  long-term abdominal pain. We discussed that this procedure can be quite uncomfortable and difficult to recover from based on how the mesh is secured to the abdominal wall. We discussed the importance of avoiding heavy lifting and straining for a period of 6 weeks. Current Plans Pt Education - Pamphlet Given - Hernia Surgery: discussed with patient and provided information. You are being  scheduled for surgery- Our schedulers will call you.  You should hear from our office's scheduling department within 5 working days about the location, date, and time of surgery. We try to make accommodations for patient's preferences in scheduling surgery, but sometimes the OR schedule or the surgeon's schedule prevents [place] from making those accommodations.  If you have not heard from our office [phone]) in 5 working days, call the office and ask for your surgeon's nurse.  If you have other questions about your diagnosis, plan, or surgery, call the office and ask for your surgeon's nurse.  OBESITY (BMI 30-39.9) (E66.9) Impression: We discussed the importance of working on weight loss now as well as after surgery to minimize recurrence risk. Because he is symptomatic I don't think that we can necessary delay surgery for several months  Mary Sella. Andrey Campanile, MD, FACS General, Bariatric, & Minimally Invasive Surgery [facility], [place]

## 2017-01-26 DIAGNOSIS — K4030 Unilateral inguinal hernia, with obstruction, without gangrene, not specified as recurrent: Secondary | ICD-10-CM | POA: Diagnosis not present

## 2017-01-26 LAB — CBC
HCT: 38.5 % — ABNORMAL LOW (ref 39.0–52.0)
HEMOGLOBIN: 13.4 g/dL (ref 13.0–17.0)
MCH: 30.5 pg (ref 26.0–34.0)
MCHC: 34.8 g/dL (ref 30.0–36.0)
MCV: 87.7 fL (ref 78.0–100.0)
PLATELETS: 167 10*3/uL (ref 150–400)
RBC: 4.39 MIL/uL (ref 4.22–5.81)
RDW: 13 % (ref 11.5–15.5)
WBC: 11.2 10*3/uL — AB (ref 4.0–10.5)

## 2017-01-26 LAB — BASIC METABOLIC PANEL
ANION GAP: 7 (ref 5–15)
BUN: 14 mg/dL (ref 6–20)
CALCIUM: 8.4 mg/dL — AB (ref 8.9–10.3)
CO2: 23 mmol/L (ref 22–32)
Chloride: 107 mmol/L (ref 101–111)
Creatinine, Ser: 0.94 mg/dL (ref 0.61–1.24)
GLUCOSE: 129 mg/dL — AB (ref 65–99)
POTASSIUM: 4.6 mmol/L (ref 3.5–5.1)
SODIUM: 137 mmol/L (ref 135–145)

## 2017-01-26 LAB — MAGNESIUM: MAGNESIUM: 1.9 mg/dL (ref 1.7–2.4)

## 2017-01-26 MED ORDER — ONDANSETRON HCL 4 MG PO TABS: 4.0000 mg | ORAL_TABLET | Freq: Three times a day (TID) | ORAL | 0 refills | Status: AC | PRN

## 2017-01-26 MED ORDER — TRAMADOL HCL 50 MG PO TABS: 50.0000 mg | ORAL_TABLET | Freq: Four times a day (QID) | ORAL | 0 refills | Status: AC | PRN

## 2017-01-26 NOTE — Discharge Summary (Signed)
Physician Discharge Summary  Stephen Trujillo ZOX:[id number] DOB: May 23, 1975 DOA: 01/25/2017  PCP: Patient, No Pcp Per  Admit date: 01/25/2017 Discharge date: 01/26/2017  Recommendations for Outpatient Follow-up:  1.   Follow-up Information    Gaynelle Adu, MD. Go on 02/10/2017.   Specialty:  General Surgery Why:  11 AM (pls arrive at 10:45 am) Contact information: [address] [place] [address] [phone]          Discharge Diagnoses:  1. Incarcerated ventral hernia  Surgical Procedure: laparoscopic assisted repair of incarcerated ventral hernia with mesh  Discharge Condition: good Disposition: home  Diet recommendation: regular  Filed Weights   01/25/17 0900  Weight: 113.9 kg (251 lb)    History of present illness:  The patient is a 41 year old male who presents with an abdominal wall hernia. He is referred by Dr. Bosie Clos for evaluation of abdominal wall mass. He was found to have an incarcerated ventral hernia containing omentum on the CT scan of the end of August. The defect measured 5 cm wide by 4 cm vertical comes containing a large amount of omentum. He states that he has had a bulge for at least 3 months. It bothersome on a daily basis. It causes a bulging pressure and occasionally a tugging sensation. After eating he will have worsening pain in that location. He denies any nausea, vomiting, diarrhea or constipation. He does have some bloating but that is better now. He will have acid reflux issues and takes reflux medication. Occasionally he will have some bile come up at night. He did have a CT scan back in 2015 at the [facility] that showed a small fat-containing ventral hernia. He denies any prior abdominal surgery. He does not smoke.   Hospital Course:  He underwent the above planned procedure. He was kept overnight for observation and for pain control. On POD 1 he was doing well. He was tolerating a diet, ambulating well, and pain  controlled. His vitals were stable. We discussed dc instructions.   BP 130/82 (BP Location: Left Arm)   Pulse 80   Temp 98.1 F (36.7 C) (Oral)   Resp 20   Ht  (1.803 m)   Wt 113.9 kg (251 lb)   SpO2 100%   BMI 35.01 kg/m   Gen: alert, NAD, non-toxic appearing Pupils: equal, no scleral icterus Pulm: Lungs clear to auscultation, symmetric chest rise CV: regular rate and rhythm Abd: soft,  Min tender.. +binder. No cellulitis. No incisional hernia Ext: no edema, no calf tenderness Skin: no rash, no jaundice    Discharge Instructions  Discharge Instructions    Call MD for:    Complete by:  As directed    Temperature >101   Call MD for:  hives    Complete by:  As directed    Call MD for:  persistant dizziness or light-headedness    Complete by:  As directed    Call MD for:  persistant nausea and vomiting    Complete by:  As directed    Call MD for:  redness, tenderness, or signs of infection (pain, swelling, redness, odor or green/yellow discharge around incision site)    Complete by:  As directed    Call MD for:  severe uncontrolled pain    Complete by:  As directed    Diet general    Complete by:  As directed    Discharge instructions    Complete by:  As directed  See [facility] discharge instructions   Increase activity slowly    Complete by:  As directed      Allergies as of 01/26/2017      Reactions   Codeine Nausea And Vomiting   Pine Hives   Hydrocodone Nausea Only      Medication List    TAKE these medications   acetaminophen 500 MG tablet Commonly known as:  TYLENOL Take 1,000 mg by mouth every 6 (six) hours as needed for moderate pain.   calcium carbonate 500 MG chewable tablet Commonly known as:  TUMS - dosed in mg elemental calcium Chew 1 tablet by mouth as needed for indigestion or heartburn.   ondansetron 4 MG tablet Commonly known as:  ZOFRAN Take 1 tablet (4 mg total) by mouth every 8 (eight) hours as needed for nausea or vomiting.    ranitidine 150 MG capsule Commonly known as:  ZANTAC Take 150 mg by mouth daily.   traMADol 50 MG tablet Commonly known as:  ULTRAM Take 1-2 tablets (50-100 mg total) by mouth every 6 (six) hours as needed.            Discharge Care Instructions        Start     Ordered   01/26/17 0000  traMADol (ULTRAM) 50 MG tablet  Every 6 hours PRN     01/26/17 0828   01/26/17 0000  ondansetron (ZOFRAN) 4 MG tablet  Every 8 hours PRN     01/26/17 0828   01/26/17 0000  Discharge instructions    Comments:  See [facility] discharge instructions   01/26/17 0938   01/26/17 0000  Increase activity slowly     01/26/17 4098   01/26/17 0000  Call MD for:    Comments:  Temperature >101   01/26/17 0938   01/26/17 0000  Call MD for:  persistant nausea and vomiting     01/26/17 0938   01/26/17 0000  Call MD for:  severe uncontrolled pain     01/26/17 0938   01/26/17 0000  Call MD for:  redness, tenderness, or signs of infection (pain, swelling, redness, odor or green/yellow discharge around incision site)     01/26/17 0938   01/26/17 0000  Call MD for:  hives     01/26/17 0938   01/26/17 0000  Call MD for:  persistant dizziness or light-headedness     01/26/17 0938   01/26/17 0000  Diet general     01/26/17 1191     Follow-up Information    Gaynelle Adu, MD. Go on 02/10/2017.   Specialty:  General Surgery Why:  11 AM (pls arrive at 10:45 am) Contact information: [address] [place] [address] [phone]            The results of significant diagnostics from this hospitalization (including imaging, microbiology, ancillary and laboratory) are listed below for reference.    Significant Diagnostic Studies: Ct Abdomen Pelvis W Contrast  Result Date: 01/04/2017 CLINICAL DATA:  Left lower abdominal mass for 2 months. EXAM: CT ABDOMEN AND PELVIS WITH CONTRAST TECHNIQUE: Multidetector CT imaging of the abdomen and pelvis was performed using the standard protocol following  bolus administration of intravenous contrast. CONTRAST:  ISOVUE-300 IOPAMIDOL (ISOVUE-300) INJECTION 61% COMPARISON:  None. FINDINGS: Lower chest: No acute abnormality. Hepatobiliary: No focal liver abnormality is seen. No gallstones, gallbladder wall thickening, or biliary dilatation. Pancreas: Unremarkable. No pancreatic ductal dilatation or surrounding inflammatory changes. Spleen: Normal in size without focal abnormality.  Adrenals/Urinary Tract: Adrenal glands are unremarkable. Kidneys are normal, without renal calculi, focal lesion, or hydronephrosis. Bladder is unremarkable. Stomach/Bowel: Stomach is within normal limits. Appendix appears normal. No evidence of bowel wall thickening, distention, or inflammatory changes. Vascular/Lymphatic: No significant vascular findings are present. No enlarged abdominal or pelvic lymph nodes. Reproductive: Prostate is unremarkable. Other: There is a supraumbilical ventral abdominal wall hernia, containing fat only, which measures 7.5 by 6.4 by 7.4 cm (volume = 190). There is soft tissue stranding within the hernia which may be a manifestation of incarceration. Musculoskeletal: No acute or significant osseous findings. IMPRESSION: 1. 190 cc Ventral abdominal wall hernia contains fat only. There is soft tissue stranding throughout the herniated fat which may be a manifestation of incarceration. These results will be called to the ordering clinician or representative by the Radiologist Assistant, and communication documented in the PACS or zVision Dashboard. Electronically Signed   By: Signa Kell M.D.   On: 01/04/2017 10:59    Microbiology: No results found for this or any previous visit (from the past 240 hour(s)).   Labs: Basic Metabolic Panel:  Recent Labs Lab 01/25/17 1531 01/26/17 0552  NA  --  137  K  --  4.6  CL  --  107  CO2  --  23  GLUCOSE  --  129*  BUN  --  14  CREATININE 0.93 0.94  CALCIUM  --  8.4*  MG  --  1.9   Liver Function  Tests: No results for input(s): AST, ALT, ALKPHOS, BILITOT, PROT, ALBUMIN in the last 168 hours. No results for input(s): LIPASE, AMYLASE in the last 168 hours. No results for input(s): AMMONIA in the last 168 hours. CBC:  Recent Labs Lab 01/25/17 0956 01/26/17 0552  WBC 3.6* 11.2*  HGB 15.1 13.4  HCT 43.5 38.5*  MCV 86.8 87.7  PLT 140* 167   Cardiac Enzymes: No results for input(s): CKTOTAL, CKMB, CKMBINDEX, TROPONINI in the last 168 hours. BNP: BNP (last 3 results) No results for input(s): BNP in the last 8760 hours.  ProBNP (last 3 results) No results for input(s): PROBNP in the last 8760 hours.  CBG: No results for input(s): GLUCAP in the last 168 hours.  Active Problems:   Incarcerated ventral hernia   Time coordinating discharge: 15 min  Signed:  Atilano Ina, MD [facility], [place] [phone] 01/26/2017, 9:42 AM

## 2017-01-26 NOTE — Discharge Instructions (Signed)
[facility]  UMBILICAL OR INGUINAL HERNIA REPAIR: POST OP INSTRUCTIONS  Always review your discharge instruction sheet given to you by the facility where your surgery was performed. IF YOU HAVE DISABILITY OR FAMILY LEAVE FORMS, YOU MUST BRING THEM TO THE OFFICE FOR PROCESSING.   DO NOT GIVE THEM TO YOUR DOCTOR.  1. A  prescription for pain medication may be given to you upon discharge.  Take your narcotic pain medication as prescribed, if needed. PLEASE ALTERNATE acetaminophen (Tylenol) AND ibuprofen (Advil) for pain control.  If you still have pain, then you make take ultram as needed. 2. Take your usually prescribed medications unless otherwise directed. 3. If you need a refill on your pain medication, please contact your pharmacy.  They will contact our office to request authorization. Prescriptions will not be filled after 5 pm or on week-ends. 4. You should follow a light diet the first 24 hours after arrival home, such as soup and crackers, etc.  Be sure to include lots of fluids daily.  Resume your normal diet the day after surgery. 5. Most patients will experience some swelling and bruising around the umbilicus or in the groin and scrotum.  Ice packs and reclining will help.  Swelling and bruising can take several days to resolve.  6. It is common to experience some constipation if taking pain medication after surgery.  Increasing fluid intake and taking a stool softener (such as Colace) will usually help or prevent this problem from occurring.  A mild laxative (Milk of Magnesia or Miralax) should be taken according to package directions if there are no bowel movements after 48 hours. 7. Unless discharge instructions indicate otherwise, you may remove your bandages 48 hours after surgery, and you may shower at that time.  You  have steri-strips (small skin tapes) in place directly over the incision.  These strips should be left on the skin for 7-10 days.  8. ACTIVITIES:   You may resume regular (light) daily activities beginning the next day--such as daily self-care, walking, climbing stairs--gradually increasing activities as tolerated.  You may have sexual intercourse when it is comfortable.  Refrain from any heavy lifting or straining until approved by your doctor. a. You may drive when you are no longer taking prescription pain medication, you can comfortably wear a seatbelt, and you can safely maneuver your car and apply brakes. b. RETURN TO WORK:  9. You should see your doctor in the office for a follow-up appointment approximately 2-3 weeks after your surgery.  Make sure that you call for this appointment within a day or two after you arrive home to insure a convenient appointment time. 10. OTHER INSTRUCTIONS: wear abdominal binder for 30 days during the daytime.     WHEN TO CALL YOUR DOCTOR: 1. Fever over 101.0 2. Inability to urinate 3. Nausea and/or vomiting 4. Extreme swelling or bruising 5. Continued bleeding from incision. 6. Increased pain, redness, or drainage from the incision  The clinic staff is available to answer your questions during regular business hours.  Please dont hesitate to call and ask to speak to one of the nurses for clinical concerns.  If you have a medical emergency, go to the nearest emergency room or call 911.  A surgeon from [facility] is always on call at the hospital   [address], [place], [place]  [address] ?  [address] [address], [place], [place]   [address] [phone] ? [phone] ? [phone] [phone]  [phone] Web site: [email or web address]

## 2017-01-26 NOTE — Progress Notes (Signed)
Pt d/c to home as per MD order. PIV removed without complication. Discharge instructions, prescriptions, follow up appts, and reasons to return to ED/MD reviewed with pt. Pt confirmed understanding using teachback method. Pt escorted off of unit via wheelchair to main lobby to care and car of girlfriend.
